# Patient Record
Sex: Female | Born: 1970 | Race: White | Hispanic: No | Marital: Single | Smoking: Never smoker
Health system: Southern US, Community
[De-identification: ages and names within clinical notes are randomized; demographics above are authoritative.]

## PROBLEM LIST (undated history)

## (undated) DIAGNOSIS — E039 Hypothyroidism, unspecified: Secondary | ICD-10-CM

## (undated) HISTORY — PX: CHOLECYSTECTOMY: SHX55

## (undated) HISTORY — PX: APPENDECTOMY: SHX54

## (undated) HISTORY — PX: OTHER SURGICAL HISTORY: SHX169

## (undated) HISTORY — PX: GASTRIC BYPASS: SHX52

---

## 2001-11-25 ENCOUNTER — Encounter: Payer: Self-pay | Admitting: Emergency Medicine

## 2001-11-25 ENCOUNTER — Emergency Department (HOSPITAL_COMMUNITY): Admission: EM | Admit: 2001-11-25 | Discharge: 2001-11-25 | Payer: Self-pay | Admitting: Emergency Medicine

## 2009-12-10 ENCOUNTER — Ambulatory Visit (HOSPITAL_COMMUNITY): Admission: RE | Admit: 2009-12-10 | Discharge: 2009-12-10 | Payer: Self-pay | Admitting: General Surgery

## 2011-01-26 LAB — COMPREHENSIVE METABOLIC PANEL
ALT: 15 U/L (ref 0–35)
AST: 17 U/L (ref 0–37)
Albumin: 3.5 g/dL (ref 3.5–5.2)
Alkaline Phosphatase: 51 U/L (ref 39–117)
BUN: 13 mg/dL (ref 6–23)
CO2: 25 mEq/L (ref 19–32)
Calcium: 8.5 mg/dL (ref 8.4–10.5)
Chloride: 107 mEq/L (ref 96–112)
Creatinine, Ser: 0.68 mg/dL (ref 0.4–1.2)
GFR calc Af Amer: >60 mL/min (ref >60)
GFR calc non Af Amer: >60 mL/min (ref >60)
Glucose, Bld: 93 mg/dL (ref 70–99)
Potassium: 3.2 mEq/L — ABNORMAL LOW (ref 3.5–5.1)
Sodium: 139 mEq/L (ref 135–145)
Total Bilirubin: 0.8 mg/dL (ref 0.3–1.2)
Total Protein: 6.4 g/dL (ref 6.0–8.3)

## 2011-01-26 LAB — PREGNANCY, URINE: Preg Test, Ur: NEGATIVE

## 2013-04-19 ENCOUNTER — Encounter (HOSPITAL_COMMUNITY): Payer: Self-pay | Admitting: Emergency Medicine

## 2013-04-19 ENCOUNTER — Inpatient Hospital Stay (HOSPITAL_COMMUNITY)
Admission: EM | Admit: 2013-04-19 | Discharge: 2013-04-20 | DRG: 883 | Disposition: A | Discharge status: Home / Self Care | Payer: BC Managed Care – PPO | Attending: Surgery | Admitting: Surgery

## 2013-04-19 ENCOUNTER — Emergency Department (HOSPITAL_COMMUNITY): Payer: BC Managed Care – PPO | Admitting: Registered Nurse

## 2013-04-19 ENCOUNTER — Encounter (HOSPITAL_COMMUNITY): Admission: EM | Disposition: A | Discharge status: Home / Self Care | Payer: Self-pay | Source: Home / Self Care

## 2013-04-19 ENCOUNTER — Emergency Department (HOSPITAL_COMMUNITY): Payer: BC Managed Care – PPO

## 2013-04-19 ENCOUNTER — Encounter (HOSPITAL_COMMUNITY): Payer: Self-pay | Admitting: Registered Nurse

## 2013-04-19 DIAGNOSIS — Z79899 Other long term (current) drug therapy: Secondary | ICD-10-CM

## 2013-04-19 DIAGNOSIS — Z791 Long term (current) use of non-steroidal anti-inflammatories (NSAID): Secondary | ICD-10-CM

## 2013-04-19 DIAGNOSIS — K37 Unspecified appendicitis: Secondary | ICD-10-CM

## 2013-04-19 DIAGNOSIS — Z6835 Body mass index (BMI) 35.0-35.9, adult: Secondary | ICD-10-CM

## 2013-04-19 DIAGNOSIS — K3580 Unspecified acute appendicitis: Secondary | ICD-10-CM

## 2013-04-19 DIAGNOSIS — Z9884 Bariatric surgery status: Secondary | ICD-10-CM

## 2013-04-19 HISTORY — PX: LAPAROSCOPIC APPENDECTOMY: SHX408

## 2013-04-19 HISTORY — DX: Hypothyroidism, unspecified: E03.9

## 2013-04-19 LAB — CBC
HCT: 37.8 % (ref 36.0–46.0)
MCHC: 34.9 g/dL (ref 30.0–36.0)
Platelets: 262 10*3/uL (ref 150–400)
RDW: 12.7 % (ref 11.5–15.5)

## 2013-04-19 LAB — URINALYSIS, ROUTINE W REFLEX MICROSCOPIC
Bilirubin Urine: NEGATIVE
Nitrite: NEGATIVE
Protein, ur: NEGATIVE mg/dL
Urobilinogen, UA: 0.2 mg/dL (ref 0.0–1.0)

## 2013-04-19 LAB — URINE MICROSCOPIC-ADD ON

## 2013-04-19 LAB — COMPREHENSIVE METABOLIC PANEL
Alkaline Phosphatase: 99 U/L (ref 39–117)
BUN: 8 mg/dL (ref 6–23)
Chloride: 101 mEq/L (ref 96–112)
GFR calc Af Amer: >90 mL/min (ref >90)
Glucose, Bld: 107 mg/dL — ABNORMAL HIGH (ref 70–99)
Potassium: 3.2 mEq/L — ABNORMAL LOW (ref 3.5–5.1)
Total Bilirubin: 0.7 mg/dL (ref 0.3–1.2)

## 2013-04-19 SURGERY — APPENDECTOMY, LAPAROSCOPIC
Anesthesia: General | Wound class: Dirty or Infected

## 2013-04-19 MED ORDER — CEFOXITIN SODIUM-DEXTROSE 1-4 GM-% IV SOLR (PREMIX)
INTRAVENOUS | Status: AC
  Filled 2013-04-19: qty 100

## 2013-04-19 MED ORDER — NEOSTIGMINE METHYLSULFATE 1 MG/ML IJ SOLN
INTRAMUSCULAR | Status: DC | PRN
  Administered 2013-04-19: 4 mg via INTRAVENOUS

## 2013-04-19 MED ORDER — BUPIVACAINE HCL (PF) 0.25 % IJ SOLN
INTRAMUSCULAR | Status: DC | PRN
  Administered 2013-04-19: 30 mL

## 2013-04-19 MED ORDER — ROCURONIUM BROMIDE 100 MG/10ML IV SOLN
INTRAVENOUS | Status: DC | PRN
  Administered 2013-04-19: 5 mg via INTRAVENOUS
  Administered 2013-04-19: 35 mg via INTRAVENOUS

## 2013-04-19 MED ORDER — GLYCOPYRROLATE 0.2 MG/ML IJ SOLN
INTRAMUSCULAR | Status: DC | PRN
  Administered 2013-04-19: 0.6 mg via INTRAVENOUS

## 2013-04-19 MED ORDER — IOHEXOL 300 MG/ML  SOLN
50.0000 mL | Freq: Once | INTRAMUSCULAR | Status: AC | PRN
  Administered 2013-04-19: 50 mL via ORAL

## 2013-04-19 MED ORDER — PROPOFOL 10 MG/ML IV BOLUS
INTRAVENOUS | Status: DC | PRN
  Administered 2013-04-19: 50 mg via INTRAVENOUS
  Administered 2013-04-19: 200 mg via INTRAVENOUS
  Administered 2013-04-19: 50 mg via INTRAVENOUS

## 2013-04-19 MED ORDER — DEXTROSE 5 % IV SOLN: 1.0000 g | Freq: Two times a day (BID) | INTRAVENOUS | Status: DC

## 2013-04-19 MED ORDER — SUFENTANIL CITRATE 50 MCG/ML IV SOLN
INTRAVENOUS | Status: DC | PRN
  Administered 2013-04-19 (×5): 10 ug via INTRAVENOUS

## 2013-04-19 MED ORDER — IOHEXOL 300 MG/ML  SOLN
100.0000 mL | Freq: Once | INTRAMUSCULAR | Status: AC | PRN
  Administered 2013-04-19: 100 mL via INTRAVENOUS

## 2013-04-19 MED ORDER — MIDAZOLAM HCL 5 MG/5ML IJ SOLN
INTRAMUSCULAR | Status: DC | PRN
  Administered 2013-04-19: 2 mg via INTRAVENOUS

## 2013-04-19 MED ORDER — LACTATED RINGERS IV SOLN
INTRAVENOUS | Status: DC | PRN
  Administered 2013-04-19 (×2): via INTRAVENOUS

## 2013-04-19 MED ORDER — HYDROMORPHONE HCL PF 1 MG/ML IJ SOLN
0.2500 mg | INTRAMUSCULAR | Status: DC | PRN
  Administered 2013-04-20 (×2): 0.5 mg via INTRAVENOUS

## 2013-04-19 MED ORDER — LIDOCAINE HCL (CARDIAC) 20 MG/ML IV SOLN
INTRAVENOUS | Status: DC | PRN
  Administered 2013-04-19: 70 mg via INTRAVENOUS

## 2013-04-19 MED ORDER — DEXTROSE 5 % IV SOLN
1.0000 g | Freq: Two times a day (BID) | INTRAVENOUS | Status: DC
  Administered 2013-04-19: 2 g via INTRAVENOUS
  Filled 2013-04-19 (×2): qty 1

## 2013-04-19 MED ORDER — DEXAMETHASONE SODIUM PHOSPHATE 10 MG/ML IJ SOLN
INTRAMUSCULAR | Status: DC | PRN
  Administered 2013-04-19: 10 mg via INTRAVENOUS

## 2013-04-19 MED ORDER — SODIUM CHLORIDE 0.9 % IV BOLUS (SEPSIS)
1000.0000 mL | Freq: Once | INTRAVENOUS | Status: AC
  Administered 2013-04-19: 1000 mL via INTRAVENOUS

## 2013-04-19 MED ORDER — 0.9 % SODIUM CHLORIDE (POUR BTL) OPTIME
TOPICAL | Status: DC | PRN
  Administered 2013-04-19: 1000 mL

## 2013-04-19 MED ORDER — SUCCINYLCHOLINE CHLORIDE 20 MG/ML IJ SOLN
INTRAMUSCULAR | Status: DC | PRN
  Administered 2013-04-19: 100 mg via INTRAVENOUS

## 2013-04-19 MED ORDER — LACTATED RINGERS IV SOLN: INTRAVENOUS | Status: DC

## 2013-04-19 MED ORDER — LABETALOL HCL 5 MG/ML IV SOLN
INTRAVENOUS | Status: DC | PRN
  Administered 2013-04-19: 5 mg via INTRAVENOUS

## 2013-04-19 MED ORDER — ONDANSETRON HCL 4 MG/2ML IJ SOLN
INTRAMUSCULAR | Status: DC | PRN
  Administered 2013-04-19: 4 mg via INTRAVENOUS

## 2013-04-19 MED ORDER — LACTATED RINGERS IR SOLN
Status: DC | PRN
  Administered 2013-04-19: 1000 mL

## 2013-04-19 SURGICAL SUPPLY — 42 items
ADH SKN CLS APL DERMABOND .7 (GAUZE/BANDAGES/DRESSINGS) ×1
APL SKNCLS STERI-STRIP NONHPOA (GAUZE/BANDAGES/DRESSINGS) ×1
APPLIER CLIP ROT 10 11.4 M/L (STAPLE) ×2
APR CLP MED LRG 11.4X10 (STAPLE) ×1
BAG SPEC RTRVL LRG 6X4 10 (ENDOMECHANICALS) ×1
BENZOIN TINCTURE PRP APPL 2/3 (GAUZE/BANDAGES/DRESSINGS) ×2 IMPLANT
CANISTER SUCTION 2500CC (MISCELLANEOUS) ×2 IMPLANT
CLIP APPLIE ROT 10 11.4 M/L (STAPLE) IMPLANT
CLOTH BEACON ORANGE TIMEOUT ST (SAFETY) ×2 IMPLANT
CUTTER FLEX LINEAR 45M (STAPLE) ×1 IMPLANT
DECANTER SPIKE VIAL GLASS SM (MISCELLANEOUS) ×1 IMPLANT
DERMABOND ADVANCED (GAUZE/BANDAGES/DRESSINGS) ×1
DERMABOND ADVANCED .7 DNX12 (GAUZE/BANDAGES/DRESSINGS) IMPLANT
DRAPE LAPAROSCOPIC ABDOMINAL (DRAPES) ×2 IMPLANT
ELECT REM PT RETURN 9FT ADLT (ELECTROSURGICAL) ×2
ELECTRODE REM PT RTRN 9FT ADLT (ELECTROSURGICAL) ×1 IMPLANT
ENDOLOOP SUT PDS II  0 18 (SUTURE)
ENDOLOOP SUT PDS II 0 18 (SUTURE) IMPLANT
GLOVE BIOGEL PI IND STRL 7.0 (GLOVE) ×1 IMPLANT
GLOVE BIOGEL PI INDICATOR 7.0 (GLOVE) ×1
GLOVE SURG SIGNA 7.5 PF LTX (GLOVE) ×2 IMPLANT
GOWN STRL NON-REIN LRG LVL3 (GOWN DISPOSABLE) ×2 IMPLANT
GOWN STRL REIN XL XLG (GOWN DISPOSABLE) ×4 IMPLANT
KIT BASIN OR (CUSTOM PROCEDURE TRAY) ×2 IMPLANT
PENCIL BUTTON HOLSTER BLD 10FT (ELECTRODE) ×1 IMPLANT
POUCH SPECIMEN RETRIEVAL 10MM (ENDOMECHANICALS) ×2 IMPLANT
RELOAD 45 VASCULAR/THIN (ENDOMECHANICALS) IMPLANT
RELOAD STAPLE 45 2.5 WHT GRN (ENDOMECHANICALS) IMPLANT
RELOAD STAPLE 45 3.5 BLU ETS (ENDOMECHANICALS) IMPLANT
RELOAD STAPLE TA45 3.5 REG BLU (ENDOMECHANICALS) ×2 IMPLANT
SCALPEL HARMONIC ACE (MISCELLANEOUS) ×2 IMPLANT
SET IRRIG TUBING LAPAROSCOPIC (IRRIGATION / IRRIGATOR) ×2 IMPLANT
SOLUTION ANTI FOG 6CC (MISCELLANEOUS) ×2 IMPLANT
STRIP CLOSURE SKIN 1/4X3 (GAUZE/BANDAGES/DRESSINGS) ×2 IMPLANT
SUT MON AB 5-0 PS2 18 (SUTURE) ×2 IMPLANT
TOWEL OR 17X26 10 PK STRL BLUE (TOWEL DISPOSABLE) ×2 IMPLANT
TRAY FOLEY CATH 14FRSI W/METER (CATHETERS) ×2 IMPLANT
TRAY LAP CHOLE (CUSTOM PROCEDURE TRAY) ×2 IMPLANT
TROCAR XCEL BLUNT TIP 100MML (ENDOMECHANICALS) ×3 IMPLANT
TROCAR Z-THREAD FIOS 11X100 BL (TROCAR) ×2 IMPLANT
TROCAR Z-THREAD FIOS 5X100MM (TROCAR) ×2 IMPLANT
TUBING INSUFFLATION 10FT LAP (TUBING) ×2 IMPLANT

## 2013-04-19 NOTE — ED Provider Notes (Signed)
History     CSN: [id number]  Arrival date & time 04/19/13  1549   First MD Initiated Contact with Patient 04/19/13 1552      Chief Complaint  Patient presents with  . Abdominal Pain    (Consider location/radiation/quality/duration/timing/severity/associated sxs/prior treatment) HPI Kristen Dorsey is a 42 y.o. female with a past medical history of cholecystectomy and gastric bypass presents to the emergency department complaining of abdominal pain.  Onset of symptoms began 2 days ago and were located primarily in the right lower quadrant.  Pain and discomfort has gradually worsened and is rated at a 6/10 in severity.  Patient denies radiation.  Associated symptoms include nausea & anorexia .  Last normal bowel movement yesterday. No known exacerbating or alleviating factors.  Symptoms are moderate.  Patient denies chest pain, shortness of breath, change in bowel movements, emesis, hematuria, abnormal vaginal dc, fevers, night sweats and chills. Pt reports black stools, however pt is on Iron supplements. Last meal today at 1 pm (consisted of 2 pieces of bread)   History reviewed. No pertinent past medical history.  Past Surgical History  Procedure Laterality Date  . Gastric bypass    . Cholecystectomy      No family history on file.  History  Substance Use Topics  . Smoking status: Never Smoker   . Smokeless tobacco: Not on file  . Alcohol Use: No    OB History   Grav Para Term Preterm Abortions TAB SAB Ect Mult Living                  Review of Systems Ten systems reviewed and are negative for acute change, except as noted in the HPI.    Allergies  Sulfa antibiotics  Home Medications   Current Outpatient Rx  Name  Route  Sig  Dispense  Refill  . drospirenone-ethinyl estradiol (SYEDA) 3-0.03 MG tablet   Oral   Take 1 tablet by mouth at bedtime.         Marland Kitchen ibuprofen (ADVIL,MOTRIN) 200 MG tablet   Oral   Take 400 mg by mouth every 6 (six) hours as needed  for pain.         . iron polysaccharides (NIFEREX) 150 MG capsule   Oral   Take 150 mg by mouth every morning.         Marland Kitchen levothyroxine (SYNTHROID, LEVOTHROID) 125 MCG tablet   Oral   Take 125 mcg by mouth at bedtime.         . sertraline (ZOLOFT) 100 MG tablet   Oral   Take 100 mg by mouth every morning.           BP 144/72  Pulse 65  Temp(Src) 98.6 F (37 C) (Oral)  Resp 16  SpO2 100%  LMP 04/06/2013  Physical Exam  Constitutional: She is oriented to person, place, and time. She appears well-developed and well-nourished. No distress.  HENT:  Head: Normocephalic and atraumatic.  Mouth/Throat: Oropharynx is clear and moist. No oropharyngeal exudate.  Eyes: Conjunctivae and EOM are normal. Pupils are equal, round, and reactive to light. No scleral icterus.  Neck: Normal range of motion. Neck supple. No tracheal deviation present. No thyromegaly present.  Cardiovascular: Normal rate, regular rhythm, normal heart sounds and intact distal pulses.   Pulmonary/Chest: Effort normal and breath sounds normal. No stridor. No respiratory distress. She has no wheezes.  Abdominal: Soft. There is tenderness.    Musculoskeletal: Normal range of motion. She exhibits no edema  and no tenderness.  Neurological: She is alert and oriented to person, place, and time. Coordination normal.  Skin: Skin is warm and dry. No rash noted. She is not diaphoretic. No erythema. No pallor.  Psychiatric: She has a normal mood and affect. Her behavior is normal.    ED Course  Procedures (including critical care time)  Labs Reviewed  CBC - Abnormal; Notable for the following:    WBC 12.0 (*)    All other components within normal limits  URINALYSIS, ROUTINE W REFLEX MICROSCOPIC - Abnormal; Notable for the following:    APPearance CLOUDY (*)    Hgb urine dipstick TRACE (*)    Ketones, ur 15 (*)    All other components within normal limits  COMPREHENSIVE METABOLIC PANEL - Abnormal; Notable for  the following:    Potassium 3.2 (*)    Glucose, Bld 107 (*)    Albumin 3.4 (*)    All other components within normal limits  URINE MICROSCOPIC-ADD ON   Ct Abdomen Pelvis Wo Contrast  04/19/2013   *RADIOLOGY REPORT*  Clinical Data: Right lower quadrant pain.  CT ABDOMEN AND PELVIS WITHOUT CONTRAST  Technique:  Multidetector CT imaging of the abdomen and pelvis was performed following the standard protocol without intravenous contrast.  Comparison: None.  Findings: Diffuse inflammation of the appendix which is enlarged with surrounding soft tissue inflammation (but without well-defined drainable abscess).  This inflammation extends to involve the cecum. Appendiceal rupture not excluded.  Adjacent tiny lymph node may be reactive origin. Tiny amount of fluid in the pelvis may be related to appendicitis. Other causes such as tumor, infection or primary cecal abnormality felt to be secondary less likely considerations.  Lung bases clear.  Post gastric bypass procedure.  Post cholecystectomy.  Evaluation of solid abdominal viscera is limited by lack of IV contrast.  Taking this limitation into account no worrisome hepatic, splenic, pancreatic, renal, or adrenal lesion.  No abdominal aortic aneurysm.  No worrisome adnexal mass.  No bony destructive lesion.  IMPRESSION: Findings consistent with appendicitis possibly with rupture. Inflammation extends to involve the cecum.  Critical Value/emergent results were called by telephone at the time of interpretation on 04/19/2013  at 8:16 p.m. to Dr. Towanda Malkin, who verbally acknowledged these results.   Original Report Authenticated By: Lacy Duverney, M.D.    nursing failed IV access x 3, IV team failed x 2. US guided IV placed by me, but infiltrated while in CT. Attending placed EJ without complication.   Medications  cefOXitin (MEFOXIN) 1 g in dextrose 5 % 50 mL IVPB ( Intravenous Automatically Held 05/05/13 2200)  lactated ringers infusion (not administered)    HYDROmorphone (DILAUDID) injection 0.25-0.5 mg (0.5 mg Intravenous Given 04/20/13 0014)  HYDROmorphone (DILAUDID) 1 MG/ML injection (not administered)  iohexol (OMNIPAQUE) 300 MG/ML solution 50 mL (50 mLs Oral Contrast Given 04/19/13 1640)  iohexol (OMNIPAQUE) 300 MG/ML solution 100 mL (100 mLs Intravenous Contrast Given 04/19/13 1944)  sodium chloride 0.9 % bolus 1,000 mL (1,000 mLs Intravenous New Bag/Given 04/19/13 2118)      No diagnosis found.    MDM  Appendicitis  42 year old female with no significant past medical history presents emergency department complaining of right lower quadrant abdominal pain onset 2 days ago.  Labs and imaging reviewed showing mild leukocytosis and concern for severe appendicitis with possible rupture and inflammation extending to involve cecum.  Patient's last meal was at 1 o'clock and consisted of a piece of bread.  General surgery consult as above  who will admit patient for surgery.  IV antibiotics started.  The patient appears reasonably stabilized for admission considering the current resources, flow, and capabilities available in the ED at this time, and I doubt any other [facility] requiring further screening and/or treatment in the ED prior to admission.         Jaci Carrel, [place] 04/20/13 [phone]

## 2013-04-19 NOTE — Op Note (Signed)
Re:   Kristen Dorsey DOB:   April 07, 1971 MRN:   [id number]                   FACILITY:  [facility]  DATE OF PROCEDURE: 04/19/2013                              OPERATIVE REPORT  PREOPERATIVE DIAGNOSIS:  Appendicitis  POSTOPERATIVE DIAGNOSIS:  Acute suppurative appendicitis.  PROCEDURE:  Laparoscopic appendectomy.  SURGEON:  Sandria Bales. Ezzard Standing, MD  ASSISTANT:  No first assistant.  ANESTHESIA:  General endotracheal.  Anesthesiologist: Gaetano Hawthorne, MD CRNA: Illene Silver, CRNA  ASA:  1E  ESTIMATED BLOOD LOSS:  Minimal.  DRAINS: none   SPECIMEN:   Appendix  COUNTS CORRECT:  YES  INDICATIONS FOR PROCEDURE: Kristen Dorsey is a 42 y.o. (DOB: 1970/12/24) white  female whose primary care doctor is No primary provider on file. and comes to the OR for an appendectomy.   I discussed with the patient, the indications and potential complications of appendiceal surgery.  The potential complications include, but are not limited to, bleeding, open surgery, bowel resection, and the possibility of another diagnosis.  OPERATIVE NOTE:  The patient underwent a general endotracheal anesthetic as supervised by Anesthesiologist: Gaetano Hawthorne, MD CRNA: Illene Silver, CRNA, General, in room #1.  The patient was given 2 g of cefoxitin at the beginning of the procedure and the abdomen was prepped with ChloraPrep.  The patient had a foley catheter placed at the beginning of the procedure.  A time-out was held and surgical checklist run.  An infraumbilical incision was made with sharp dissection carried down to the abdominal cavity.  An 12 mm Hasson trocar was inserted through the infraumbilical incision and into the peritoneal cavity.  A 0 degree 10 mm laparoscope was inserted through a 12 mm Hasson trocar and the Hasson trocar secured with a 0 Vicryl suture.  I placed a 5 mm trocar in the right upper quadrant and 12 mm torcar in left lower quadrant and did abdominal exploration.    The right and  left lobes of liver unremarkable.  Stomach was unremarkable.  The pelvic organs were unremarkable. The patient had adhesions in the upper midline, under the old incision for the gastric bypass.  The patient had appendicitis with the appendix located in the right lower quadrant.  The cecum was plastered against the right lower quadrant side wall and the appendix came off medially.  I could get to the base of the appendix without having to mobilize the cecum.  The mesentery of the appendix was divided with a Harmonic scalpel.  I got to the base of the appendix.  I then used a blue load 45 mm Ethicon Endo-GIA stapler and fired this across the base of the appendix.  I placed the appendix in EndoCatch bag and delivered the bag through the umbilical incision.  I irrigated the abdomen with 300 cc of saline.  After irrigating the abdomen, I then removed the trocars, in turn.  The umbilical port fascia was closed with 0 Vicryl suture.   I closed the skin each site with a 5-0 Vicryl suture and painted the wounds with Dermabond.  I then injected a total of 30 mL of 0.25% Marcaine at the incisions.  Sponge and needle count were correct at the end of the case.  The foley catheter was removed in the OR.  The  patient was transferred to the recovery room in good condition.  The patient tolerated the procedure well and it depends on the patient's post op clinical course as to when she could be discharged.   Ovidio Kin, MD, [facility] Pager: [phone] Office phone:  [phone]

## 2013-04-19 NOTE — Anesthesia Preprocedure Evaluation (Signed)
Anesthesia Evaluation  Patient identified by MRN, date of birth, ID band Patient awake    Reviewed: Allergy & Precautions, H&P , NPO status , Patient's Chart, lab work & pertinent test results  Airway Mallampati: II TM Distance: >3 FB Neck ROM: full    Dental no notable dental hx.    Pulmonary neg pulmonary ROS,  breath sounds clear to auscultation  Pulmonary exam normal       Cardiovascular Exercise Tolerance: Good negative cardio ROS  Rhythm:regular Rate:Normal     Neuro/Psych negative neurological ROS  negative psych ROS   GI/Hepatic negative GI ROS, Neg liver ROS,   Endo/Other  negative endocrine ROSHypothyroidism   Renal/GU negative Renal ROS  negative genitourinary   Musculoskeletal   Abdominal   Peds  Hematology negative hematology ROS (+)   Anesthesia Other Findings   Reproductive/Obstetrics negative OB ROS                           Anesthesia Physical Anesthesia Plan  ASA: I and emergent  Anesthesia Plan: General   Post-op Pain Management:    Induction: Intravenous, Rapid sequence and Cricoid pressure planned  Airway Management Planned: Oral ETT  Additional Equipment:   Intra-op Plan:   Post-operative Plan: Extubation in OR  Informed Consent: I have reviewed the patients History and Physical, chart, labs and discussed the procedure including the risks, benefits and alternatives for the proposed anesthesia with the patient or authorized representative who has indicated his/her understanding and acceptance.   Dental Advisory Given  Plan Discussed with: CRNA and Surgeon  Anesthesia Plan Comments:         Anesthesia Quick Evaluation

## 2013-04-19 NOTE — ED Notes (Signed)
Two RNs both unsuccessful x2 at IV and blood draw attempts.   IV team paged.

## 2013-04-19 NOTE — H&P (Signed)
Re:   Kristen Dorsey DOB:   September 21, 1971 MRN:   [id number]  ASSESSMENT AND PLAN: 1.  Appendicitis, possibly ruptured per CT scan  I discussed with the patient the indications and risks of appendiceal surgery.  The primary risks of appendiceal surgery include, but are not limited to, bleeding, infection, bowel surgery, and open surgery.  There is also the risk that the patient may have continued symptoms after surgery.  However, the likelihood of improvement in symptoms and return to the patient's normal status is good. We discussed the typical post-operative recovery course. I tried to answer the patient's questions.  2.  S/P gastric bypass   Dr. Jayme Cloud - 2008 3.  Morbid obesity 4.  Anxiety.  Chief Complaint  Patient presents with  . Abdominal Pain   REFERRING PHYSICIAN: Dr. Rhunette Croft, [facility]os  HISTORY OF PRESENT ILLNESS: Kristen Dorsey is a 42 y.o. (DOB: May 05, 1971) white  female whose primary care physician is Dr. Selinda Flavin, [place], and comes to the [facility] with abdominal pain x 18 hours.  She was doing well until last PM when she developed abdominal pain.  She went to bed, but woke up with the same abdominal pain.  She's nauseated, but has not vomited.  She tried to work, but could not because of the pain.  She went to Urgent Care first and then they sent her to the [facility].  Her prior GI history is that she had a RYGB by Dr. Jayme Cloud in 2008.  She lost from 350 to 170, but she is now back up to 250.  She had a lap chole by Dr. Freida Busman around 2011.   History reviewed. No pertinent past medical history.  Past Surgical History  Procedure Laterality Date  . Gastric bypass    . Cholecystectomy       Current Facility-Administered Medications  Medication Dose Route Frequency Provider Last Rate Last Dose  . cefOXitin (MEFOXIN) 1 g in dextrose 5 % 50 mL IVPB  1 g Intravenous Q12H Derwood Kaplan, MD       Current Outpatient Prescriptions  Medication Sig Dispense Refill  .  drospirenone-ethinyl estradiol (SYEDA) 3-0.03 MG tablet Take 1 tablet by mouth at bedtime.      Marland Kitchen ibuprofen (ADVIL,MOTRIN) 200 MG tablet Take 400 mg by mouth every 6 (six) hours as needed for pain.      . iron polysaccharides (NIFEREX) 150 MG capsule Take 150 mg by mouth every morning.      Marland Kitchen levothyroxine (SYNTHROID, LEVOTHROID) 125 MCG tablet Take 125 mcg by mouth at bedtime.      . sertraline (ZOLOFT) 100 MG tablet Take 100 mg by mouth every morning.         Allergies  Allergen Reactions  . Sulfa Antibiotics Hives    REVIEW OF SYSTEMS: Skin:  No history of rash.  No history of abnormal moles. Infection:  No history of hepatitis or HIV.  No history of MRSA. Neurologic:  No history of stroke.  No history of seizure.  No history of headaches. Cardiac:  No history of hypertension. No history of heart disease.  No history of prior cardiac catheterization.  No history of seeing a cardiologist. Pulmonary:  Does not smoke cigarettes.  No asthma or bronchitis.  No OSA/CPAP.  Endocrine:  No diabetes. No thyroid disease. Gastrointestinal:  History of cholecystectomy- 12/10/2009 - A. Freida Busman, and gastric bypass - 2008 - Dr. Jayme Cloud, [place]. Urologic:  No history of kidney stones.  No history of bladder infections.  Musculoskeletal:  No history of joint or back disease. Hematologic:  No bleeding disorder.  No history of anemia.  Not anticoagulated. Psycho-social:  The patient is oriented.   The patient has no obvious psychologic or social impairment to understanding our conversation and plan.  On Zoloft for anxiety.  SOCIAL and FAMILY HISTORY: Single. Lives by self.  From [place], [place]. 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 Friend Dorian Furnace with patient.  PHYSICAL EXAM: BP 144/72  Pulse 65  Temp(Src) 98.6 F (37 C) (Oral)  Resp 16  SpO2 100%  LMP 04/06/2013  General: Heavy WF who is alert and generally healthy appearing.  HEENT:  Normal. Pupils equal. Neck: Supple. No mass.  No thyroid mass. Lymph Nodes:  No supraclavicular or cervical nodes. Lungs: Clear to auscultation and symmetric breath sounds. Heart:  RRR. No murmur or rub. Abdomen: Soft. No mass. No tenderness. No hernia. Normal bowel sounds.  Well healed upper midline scar.  Tender RLQ with mild guarding. Rectal: Not done. Extremities:  Good strength and ROM  in upper and lower extremities. Neurologic:  Grossly intact to motor and sensory function. Psychiatric: Has normal mood and affect. Behavior is normal.   DATA REVIEWED: WBC - 12,000 - 04/19/2013 CT scan - "Findings consistent with appendicitis possibly with rupture. Inflammation extends to involve the cecum."  Kristen Kin, MD,  [facility] [place] [address] [place]  [address]   [place], [place]    [address] Phone:  [phone] FAX:  [phone]

## 2013-04-19 NOTE — ED Notes (Addendum)
Pt c/o gen low abd pain since last night.  States that she went to urgent care and they sent her here to r/o appy.  States she is nauseated but denies V/D.  Pt brought a CD of test results.

## 2013-04-19 NOTE — ED Notes (Signed)
IV unsuccessful getting IV, blood unable to be drawn yet. Charge unsuccessful IV attempt. PA will attempt with ultrasound

## 2013-04-20 ENCOUNTER — Encounter (HOSPITAL_COMMUNITY): Payer: Self-pay | Admitting: *Deleted

## 2013-04-20 MED ORDER — CEFOXITIN SODIUM 1 G IV SOLR
1.0000 g | Freq: Four times a day (QID) | INTRAVENOUS | Status: DC
  Administered 2013-04-20: 1 g via INTRAVENOUS
  Filled 2013-04-20 (×5): qty 1

## 2013-04-20 MED ORDER — LEVOTHYROXINE SODIUM 125 MCG PO TABS
125.0000 ug | ORAL_TABLET | Freq: Every day | ORAL | Status: DC
  Filled 2013-04-20: qty 1

## 2013-04-20 MED ORDER — SERTRALINE HCL 100 MG PO TABS
100.0000 mg | ORAL_TABLET | Freq: Every morning | ORAL | Status: DC
  Administered 2013-04-20: 100 mg via ORAL
  Filled 2013-04-20: qty 1

## 2013-04-20 MED ORDER — HYDROMORPHONE HCL PF 1 MG/ML IJ SOLN
INTRAMUSCULAR | Status: AC
  Filled 2013-04-20: qty 1

## 2013-04-20 MED ORDER — HYDROCODONE-ACETAMINOPHEN 5-325 MG PO TABS: 1.0000 | ORAL_TABLET | ORAL | Status: DC | PRN

## 2013-04-20 MED ORDER — ONDANSETRON HCL 4 MG/2ML IJ SOLN: 4.0000 mg | Freq: Four times a day (QID) | INTRAMUSCULAR | Status: DC | PRN

## 2013-04-20 MED ORDER — HEPARIN SODIUM (PORCINE) 5000 UNIT/ML IJ SOLN
5000.0000 [IU] | Freq: Three times a day (TID) | INTRAMUSCULAR | Status: DC
  Administered 2013-04-20: 5000 [IU] via SUBCUTANEOUS
  Filled 2013-04-20 (×4): qty 1

## 2013-04-20 MED ORDER — KCL IN DEXTROSE-NACL 20-5-0.45 MEQ/L-%-% IV SOLN
INTRAVENOUS | Status: DC
  Administered 2013-04-20: 03:00:00 via INTRAVENOUS
  Filled 2013-04-20 (×3): qty 1000

## 2013-04-20 MED ORDER — MORPHINE SULFATE 2 MG/ML IJ SOLN: 1.0000 mg | INTRAMUSCULAR | Status: DC | PRN

## 2013-04-20 MED ORDER — ONDANSETRON HCL 4 MG PO TABS: 4.0000 mg | ORAL_TABLET | Freq: Four times a day (QID) | ORAL | Status: DC | PRN

## 2013-04-20 MED ORDER — IBUPROFEN 600 MG PO TABS
600.0000 mg | ORAL_TABLET | Freq: Four times a day (QID) | ORAL | Status: DC | PRN
  Filled 2013-04-20: qty 1

## 2013-04-20 NOTE — Transfer of Care (Signed)
Immediate Anesthesia Transfer of Care Note  Patient: Kristen Dorsey  Procedure(s) Performed: Procedure(s): APPENDECTOMY LAPAROSCOPIC (N/A)  Patient Location: PACU  Anesthesia Type:General  Level of Consciousness: awake, alert , oriented and patient cooperative  Airway & Oxygen Therapy: Patient Spontanous Breathing and Patient connected to face mask oxygen  Post-op Assessment: Report given to PACU RN, Post -op Vital signs reviewed and stable and Patient moving all extremities X 4  Post vital signs: stable  Complications: No apparent anesthesia complications

## 2013-04-20 NOTE — ED Provider Notes (Signed)
Shared service with midlevel provider. I have personally seen and examined the patient, providing direct face to face care, presenting with the chief complaint of RLQ pain. Physical exam findings include RLQ tenderness, with McBurneys. Plan will be to admit to OR for appendectomy. Antibiotics started. I have reviewed the nursing documentation on past medical history, family history, and social history.  Angiocath insertion Performed by: Derwood Kaplan  Consent: Verbal consent obtained. Risks and benefits: risks, benefits and alternatives were discussed Time out: Immediately prior to procedure a "time out" was called to verify the correct patient, procedure, equipment, support staff and site/side marked as required.  Preparation: Patient was prepped and draped in the usual sterile fashion.  Vein Location: right EJ  Ultrasound Guided  Gauge: 20 gauge  Normal blood return and flush without difficulty Patient tolerance: Patient tolerated the procedure well with no immediate complications.      Derwood Kaplan, MD 04/20/13 1526

## 2013-04-20 NOTE — Anesthesia Postprocedure Evaluation (Addendum)
  Anesthesia Post-op Note  Patient: Kristen Dorsey  Procedure(s) Performed: Procedure(s) (LRB): APPENDECTOMY LAPAROSCOPIC (N/A)  Patient Location: PACU  Anesthesia Type: General  Level of Consciousness: awake and alert   Airway and Oxygen Therapy: Patient Spontanous Breathing  Post-op Pain: mild  Post-op Assessment: Post-op Vital signs reviewed, Patient's Cardiovascular Status Stable, Respiratory Function Stable, Patent Airway and No signs of Nausea or vomiting  Last Vitals:  Filed Vitals:   04/19/13 2345  BP: 146/81  Pulse: 73  Temp:   Resp: 20    Post-op Vital Signs: stable   Complications: No apparent anesthesia complications

## 2013-04-20 NOTE — Discharge Summary (Signed)
Physician Discharge Summary  Patient ID: Kristen Dorsey MRN: [id number] DOB/AGE: 1971/10/22 42 y.o.  Admit date: 04/19/2013 Discharge date: 04/20/2013  Admission Diagnoses:  appendicitis  Discharge Diagnoses:  appendicitis  Active Problems:   * No active hospital problems. *   Surgery:  Laparoscopic appendectomy  Discharged Condition: improved  Hospital Course:   Admitted Saturday night and ready for discharge on Sunday.  Taking POs  Consults: none  Significant Diagnostic Studies: none    Discharge Exam: Blood pressure 124/70, pulse 72, temperature 98.2 F (36.8 C), temperature source Oral, resp. rate 20, height 5\' 10"  (1.778 m), weight 250 lb (113.399 kg), last menstrual period 04/06/2013, SpO2 100.00%. Abdomen is minimally sore.  Improved.   Disposition: Final discharge disposition not confirmed  Discharge Orders   Future Orders Complete By Expires     Diet - low sodium heart healthy  As directed     Increase activity slowly  As directed     Remove dressing in 24 hours  As directed     Scheduling Instructions:      May shower at home        Medication List    TAKE these medications       HYDROcodone-acetaminophen 5-325 MG per tablet  Commonly known as:  NORCO/VICODIN  Take 1-2 tablets by mouth every 4 (four) hours as needed.     ibuprofen 200 MG tablet  Commonly known as:  ADVIL,MOTRIN  Take 400 mg by mouth every 6 (six) hours as needed for pain.     iron polysaccharides 150 MG capsule  Commonly known as:  NIFEREX  Take 150 mg by mouth every morning.     levothyroxine 125 MCG tablet  Commonly known as:  SYNTHROID, LEVOTHROID  Take 125 mcg by mouth at bedtime.     sertraline 100 MG tablet  Commonly known as:  ZOLOFT  Take 100 mg by mouth every morning.     SYEDA 3-0.03 MG tablet  Generic drug:  drospirenone-ethinyl estradiol  Take 1 tablet by mouth at bedtime.           Follow-up Information   Follow up with Kandis Cocking, MD.   Contact information:   [address] [place] [address] [phone]       Signed: Valarie Merino 04/20/2013, 9:11 AM

## 2013-04-21 ENCOUNTER — Encounter (HOSPITAL_COMMUNITY): Payer: Self-pay | Admitting: Surgery

## 2013-05-13 ENCOUNTER — Ambulatory Visit (INDEPENDENT_AMBULATORY_CARE_PROVIDER_SITE_OTHER): Payer: BC Managed Care – PPO | Admitting: Surgery

## 2013-05-13 ENCOUNTER — Encounter (INDEPENDENT_AMBULATORY_CARE_PROVIDER_SITE_OTHER): Payer: Self-pay | Admitting: Surgery

## 2013-05-13 VITALS — BP 128/74 | HR 70 | Temp 98.4°F | Resp 16 | Ht 70.0 in | Wt 242.8 lb

## 2013-05-13 DIAGNOSIS — K3580 Unspecified acute appendicitis: Secondary | ICD-10-CM

## 2013-05-13 NOTE — Progress Notes (Signed)
[facility]  Ovidio Kin, MD,  FACS [address] [place]  [address] [place], [place]    [address] Phone:  [phone] FAX:  [phone]   Re:   Kristen Dorsey DOB:   1971-06-04 MRN:   [id number]  ASSESSMENT AND PLAN: 1. Appendicitis, possibly ruptured per CT scan   Lap appendectomy - 04/21/2013 - D. Catrell Morrone  Did well.  Return PRN. 2. S/P gastric bypass   Dr. Jayme Cloud - 2008  3. Morbid obesity  4. Anxiety.  HISTORY OF PRESENT ILLNESS: Chief Complaint  Patient presents with  . Routine Post Op    1st po apendectomy   Kristen Dorsey is a 42 y.o. (DOB: 02/20/1971)  whfemale who is a patient of HOWARD, KEVIN, MD and comes to me today for follow up appendectomy. She has done well.  Was ready to go back to work soon after surgery.  No issues. I wrote her a note to return to work 05/14/2013.  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  PHYSICAL EXAM: BP 128/74  Pulse 70  Temp(Src) 98.4 F (36.9 C) (Temporal)  Resp 16  Ht 5\' 10"  (1.778 m)  Wt 242 lb 12.8 oz (110.133 kg)  BMI 34.84 kg/m2  LMP 04/06/2013  Abdomen:  Soft.  BS present.  Wounds look good.  DATA REVIEWED: Path to patient.  Ovidio Kin, MD, FACS Office:  [phone]

## 2013-11-24 ENCOUNTER — Ambulatory Visit: Payer: BC Managed Care – PPO | Attending: Specialist | Admitting: Physical Therapy

## 2013-11-24 DIAGNOSIS — M25569 Pain in unspecified knee: Secondary | ICD-10-CM | POA: Insufficient documentation

## 2013-11-24 DIAGNOSIS — IMO0001 Reserved for inherently not codable concepts without codable children: Secondary | ICD-10-CM | POA: Insufficient documentation

## 2013-11-24 DIAGNOSIS — R269 Unspecified abnormalities of gait and mobility: Secondary | ICD-10-CM | POA: Insufficient documentation

## 2013-11-24 DIAGNOSIS — R5381 Other malaise: Secondary | ICD-10-CM | POA: Insufficient documentation

## 2013-11-24 DIAGNOSIS — Z9884 Bariatric surgery status: Secondary | ICD-10-CM | POA: Insufficient documentation

## 2013-11-24 DIAGNOSIS — M25669 Stiffness of unspecified knee, not elsewhere classified: Secondary | ICD-10-CM | POA: Insufficient documentation

## 2013-11-26 ENCOUNTER — Ambulatory Visit: Payer: BC Managed Care – PPO | Admitting: Physical Therapy

## 2013-12-02 ENCOUNTER — Ambulatory Visit: Payer: BC Managed Care – PPO | Admitting: *Deleted

## 2013-12-04 ENCOUNTER — Ambulatory Visit: Payer: BC Managed Care – PPO | Admitting: *Deleted

## 2013-12-09 ENCOUNTER — Ambulatory Visit: Payer: BC Managed Care – PPO | Attending: Specialist | Admitting: *Deleted

## 2013-12-09 DIAGNOSIS — Z9884 Bariatric surgery status: Secondary | ICD-10-CM | POA: Insufficient documentation

## 2013-12-09 DIAGNOSIS — R5381 Other malaise: Secondary | ICD-10-CM | POA: Diagnosis not present

## 2013-12-09 DIAGNOSIS — M25569 Pain in unspecified knee: Secondary | ICD-10-CM | POA: Insufficient documentation

## 2013-12-09 DIAGNOSIS — M25669 Stiffness of unspecified knee, not elsewhere classified: Secondary | ICD-10-CM | POA: Diagnosis not present

## 2013-12-09 DIAGNOSIS — R269 Unspecified abnormalities of gait and mobility: Secondary | ICD-10-CM | POA: Diagnosis not present

## 2013-12-09 DIAGNOSIS — IMO0001 Reserved for inherently not codable concepts without codable children: Secondary | ICD-10-CM | POA: Diagnosis present

## 2013-12-11 ENCOUNTER — Ambulatory Visit: Payer: BC Managed Care – PPO | Admitting: *Deleted

## 2013-12-11 DIAGNOSIS — IMO0001 Reserved for inherently not codable concepts without codable children: Secondary | ICD-10-CM | POA: Diagnosis not present

## 2013-12-16 ENCOUNTER — Ambulatory Visit: Payer: BC Managed Care – PPO | Admitting: Physical Therapy

## 2013-12-16 DIAGNOSIS — IMO0001 Reserved for inherently not codable concepts without codable children: Secondary | ICD-10-CM | POA: Diagnosis not present

## 2013-12-18 ENCOUNTER — Ambulatory Visit: Payer: BC Managed Care – PPO | Admitting: Physical Therapy

## 2013-12-18 DIAGNOSIS — IMO0001 Reserved for inherently not codable concepts without codable children: Secondary | ICD-10-CM | POA: Diagnosis not present

## 2013-12-23 ENCOUNTER — Encounter: Payer: BC Managed Care – PPO | Admitting: Physical Therapy

## 2013-12-25 ENCOUNTER — Ambulatory Visit: Payer: BC Managed Care – PPO | Admitting: *Deleted

## 2013-12-25 DIAGNOSIS — IMO0001 Reserved for inherently not codable concepts without codable children: Secondary | ICD-10-CM | POA: Diagnosis not present

## 2013-12-30 ENCOUNTER — Encounter: Payer: BC Managed Care – PPO | Admitting: *Deleted

## 2014-01-01 ENCOUNTER — Encounter: Payer: BC Managed Care – PPO | Admitting: *Deleted

## 2014-01-05 ENCOUNTER — Encounter (HOSPITAL_COMMUNITY): Payer: Self-pay | Admitting: Emergency Medicine

## 2014-01-05 ENCOUNTER — Emergency Department (HOSPITAL_COMMUNITY)
Admission: EM | Admit: 2014-01-05 | Discharge: 2014-01-05 | Disposition: A | Discharge status: Home / Self Care | Payer: BC Managed Care – PPO | Attending: Emergency Medicine | Admitting: Emergency Medicine

## 2014-01-05 DIAGNOSIS — F411 Generalized anxiety disorder: Secondary | ICD-10-CM | POA: Insufficient documentation

## 2014-01-05 DIAGNOSIS — E039 Hypothyroidism, unspecified: Secondary | ICD-10-CM | POA: Insufficient documentation

## 2014-01-05 DIAGNOSIS — F3289 Other specified depressive episodes: Secondary | ICD-10-CM | POA: Insufficient documentation

## 2014-01-05 DIAGNOSIS — F419 Anxiety disorder, unspecified: Secondary | ICD-10-CM

## 2014-01-05 DIAGNOSIS — F329 Major depressive disorder, single episode, unspecified: Secondary | ICD-10-CM | POA: Insufficient documentation

## 2014-01-05 DIAGNOSIS — Z3202 Encounter for pregnancy test, result negative: Secondary | ICD-10-CM | POA: Insufficient documentation

## 2014-01-05 DIAGNOSIS — Z79899 Other long term (current) drug therapy: Secondary | ICD-10-CM | POA: Insufficient documentation

## 2014-01-05 LAB — RAPID URINE DRUG SCREEN, HOSP PERFORMED
AMPHETAMINES: NOT DETECTED
BARBITURATES: NOT DETECTED
BENZODIAZEPINES: NOT DETECTED
COCAINE: NOT DETECTED
Opiates: NOT DETECTED
Tetrahydrocannabinol: NOT DETECTED

## 2014-01-05 LAB — PREGNANCY, URINE: PREG TEST UR: NEGATIVE

## 2014-01-05 NOTE — ED Notes (Signed)
Pt states she has been "more depressed and anxious than usual recently". Pt was seen by PCP about her symptoms and was referred to "a doctor at behavioral health". [facility] and was advised to come to ED for psychiatric evaluation. Pt is a Engineer, siteschool teacher and states her work "has become a lot to handle". Pt denies SI/HI. Pt calm and cooperative during assessment. Belonging stored in EMS room.

## 2014-01-05 NOTE — ED Notes (Addendum)
Sent by her MD for eval, unable to get appt until April with [facility]  [place].  Says she feels anxious and stressed.    Denies being suicidal   Wanded at triage

## 2014-01-05 NOTE — ED Provider Notes (Signed)
CSN: [id number]     Arrival date & time 01/05/14  1115 History   This chart was scribed for Doug SouSam [name], MD by Ladona Ridgelaylor Day, ED scribe. This patient was seen in room APA16A/APA16A and the patient's care was started at 1115.  Chief Complaint  Patient presents with  . V70.1   Patient is a 43 y.o. female presenting with anxiety. The history is provided by the patient. No language interpreter was used.  Anxiety This is a new problem. The current episode started more than 1 week ago. The problem occurs constantly. The problem has been gradually worsening. Pertinent negatives include no chest pain and no abdominal pain. Nothing aggravates the symptoms. Nothing relieves the symptoms. She has tried nothing for the symptoms.   HPI Comments: Kristen Dorsey is a 43 y.o. 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 She had a referral from her PCP to meet w/a counselor but was unable to get an appt until 2 months from now. She reports this problem has been ongoing for a few months. She denies any SI or HI. Her PCP Dr. Dimas AguasHoward rx her w/xanax which she takes as needed to help her sleep or anxiety with partial relief. She also takes Zoloft.   She denies med hx. She does not smoke/drink. Takes no illegal drugs.  She is allergic to Sulfa  Past Medical History  Diagnosis Date  . Hypothyroidism    Past Surgical History  Procedure Laterality Date  . Gastric bypass    . Cholecystectomy    . L knee lateral release Right   . Laparoscopic appendectomy N/A 04/19/2013    Procedure: APPENDECTOMY LAPAROSCOPIC;  Surgeon: Kandis Cockingavid H Newman, MD;  Location: [facility];  Service: General;  Laterality: N/A;  . Appendectomy  [id number]   Family History  Problem Relation Age of Onset  . Heart disease Father    History  Substance Use Topics  . Smoking  status: Never Smoker   . Smokeless tobacco: Never Used  . Alcohol Use: No   OB History   Grav Para Term Preterm Abortions TAB SAB Ect Mult Living                 Review of Systems  Constitutional: Negative.   HENT: Negative.   Respiratory: Negative.   Cardiovascular: Negative.  Negative for chest pain.  Gastrointestinal: Negative.  Negative for abdominal pain.  Musculoskeletal: Negative.   Skin: Negative.   Neurological: Negative.   Psychiatric/Behavioral: Negative for suicidal ideas and self-injury. The patient is nervous/anxious.   All other systems reviewed and are negative.   Allergies  Sulfa antibiotics  Home Medications   Current Outpatient Rx  Name  Route  Sig  Dispense  Refill  . drospirenone-ethinyl estradiol (SYEDA) 3-0.03 MG tablet   Oral   Take 1 tablet by mouth at bedtime.         Marland Kitchen. levothyroxine (SYNTHROID, LEVOTHROID) 125 MCG tablet   Oral   Take 125 mcg by mouth at bedtime.         Marland Kitchen. loratadine (CLARITIN) 10 MG tablet   Oral   Take 10 mg by mouth daily.         Marland Kitchen. POLY-IRON 150 FORTE 150-25-1 MG-MCG-MG CAPS   Oral   Take 1 capsule by mouth daily.          .Marland Kitchen  sertraline (ZOLOFT) 100 MG tablet   Oral   Take 100 mg by mouth every morning.          Triage Vitals: BP 142/79  Pulse 87  Temp(Src) 98.4 F (36.9 C) (Oral)  Resp 20  Ht 5\' 11"  (1.803 m)  Wt 250 lb (113.399 kg)  BMI 34.88 kg/m2  SpO2 100%  LMP 12/22/2013  Physical Exam  Nursing note and vitals reviewed. Constitutional: She is oriented to person, place, and time. She appears well-developed and well-nourished. No distress.  HENT:  Head: Normocephalic and atraumatic.  Eyes: Conjunctivae are normal. Right eye exhibits no discharge. Left eye exhibits no discharge.  Neck: Normal range of motion.  Cardiovascular: Normal rate.   Pulmonary/Chest: Effort normal. No respiratory distress.  Musculoskeletal: Normal range of motion. She exhibits no edema.  Neurological: She is alert  and oriented to person, place, and time.  Skin: Skin is warm and dry.  Psychiatric: She has a normal mood and affect. Thought content normal.    ED Course  Procedures (including critical care time) DIAGNOSTIC STUDIES: Oxygen Saturation is 100% on room air, normal by my interpretation.    COORDINATION OF CARE: At 1235 PM Discussed treatment plan with patient which includes UD, pregnancy UC. Patient agrees.   Labs Review Labs Reviewed  URINE RAPID DRUG SCREEN (HOSP PERFORMED)  PREGNANCY, URINE   Imaging Review No results found.   EKG Interpretation None      Results for orders placed during the hospital encounter of 01/05/14  URINE RAPID DRUG SCREEN (HOSP PERFORMED)      Result Value Ref Range   Opiates NONE DETECTED  NONE DETECTED   Cocaine NONE DETECTED  NONE DETECTED   Benzodiazepines NONE DETECTED  NONE DETECTED   Amphetamines NONE DETECTED  NONE DETECTED   Tetrahydrocannabinol NONE DETECTED  NONE DETECTED   Barbiturates NONE DETECTED  NONE DETECTED  PREGNANCY, URINE      Result Value Ref Range   Preg Test, Ur NEGATIVE  NEGATIVE   No results found.   MDM   Final diagnoses:  None   patient requires counseling. She is not risk for self-harm Plan referral [facility], [facility] and [facility] Diagnosis anxiety    I personally performed the services described in this documentation, which was scribed in my presence. The recorded information has been reviewed and is accurate.      Doug Sou, MD 01/05/14 1435

## 2014-01-05 NOTE — Discharge Instructions (Signed)
Panic Attacks Call the [facility], [facility] or [facility] to arrange to receive help with  Anxiety. If you have any thought of harming yourself or others call 911 immediately Panic attacks are sudden, short feelings of great fear or discomfort. You may have them for no reason when you are relaxed, when you are uneasy (anxious), or when you are sleeping.  HOME CARE  Take all your medicines as told.  Check with your doctor before starting new medicines.  Keep all doctor visits. GET HELP IF:  You are not able to take your medicines as told.  Your symptoms do not get better.  Your symptoms get worse. GET HELP RIGHT AWAY IF:  Your attacks seem different than your normal attacks.  You have thoughts about hurting yourself or others.  You take panic attack medicine and you have a side effect. MAKE SURE YOU:  Understand these instructions.  Will watch your condition.  Will get help right away if you are not doing well or get worse. Document Released: 11/25/2010 Document Revised: 08/13/2013 Document Reviewed: 06/06/2013 [facility] Patient Information 2014 [place] [place].

## 2014-01-06 ENCOUNTER — Ambulatory Visit: Payer: BC Managed Care – PPO | Attending: Specialist | Admitting: Physical Therapy

## 2014-01-06 DIAGNOSIS — IMO0001 Reserved for inherently not codable concepts without codable children: Secondary | ICD-10-CM | POA: Insufficient documentation

## 2014-01-06 DIAGNOSIS — M25669 Stiffness of unspecified knee, not elsewhere classified: Secondary | ICD-10-CM | POA: Diagnosis not present

## 2014-01-06 DIAGNOSIS — R5381 Other malaise: Secondary | ICD-10-CM | POA: Diagnosis not present

## 2014-01-06 DIAGNOSIS — M25569 Pain in unspecified knee: Secondary | ICD-10-CM | POA: Diagnosis not present

## 2014-01-06 DIAGNOSIS — Z9884 Bariatric surgery status: Secondary | ICD-10-CM | POA: Diagnosis not present

## 2014-01-06 DIAGNOSIS — R269 Unspecified abnormalities of gait and mobility: Secondary | ICD-10-CM | POA: Diagnosis not present

## 2014-01-08 ENCOUNTER — Ambulatory Visit: Payer: BC Managed Care – PPO | Admitting: Physical Therapy

## 2014-01-08 DIAGNOSIS — IMO0001 Reserved for inherently not codable concepts without codable children: Secondary | ICD-10-CM | POA: Diagnosis not present

## 2014-01-13 ENCOUNTER — Ambulatory Visit: Payer: BC Managed Care – PPO | Admitting: Physical Therapy

## 2014-01-13 DIAGNOSIS — IMO0001 Reserved for inherently not codable concepts without codable children: Secondary | ICD-10-CM | POA: Diagnosis not present

## 2014-01-15 ENCOUNTER — Ambulatory Visit: Payer: BC Managed Care – PPO | Admitting: Physical Therapy

## 2014-01-15 DIAGNOSIS — IMO0001 Reserved for inherently not codable concepts without codable children: Secondary | ICD-10-CM | POA: Diagnosis not present

## 2014-06-12 IMAGING — CT CT ABD-PELV W/O CM
1 of 2 series · 15 of 32 positions shown, 19 images · non-contrast
Comparison: None.

CLINICAL DATA: Right lower quadrant pain.

CT ABDOMEN AND PELVIS WITHOUT CONTRAST
TECHNIQUE: Multidetector CT imaging of the abdomen and pelvis was
performed following the standard protocol without intravenous
contrast.

[Series 2: abd/pel w/o · axial · non-contrast · 0.78mm/px · z∈[-484,-49]mm · 15 of 95 slices shown, 19 images]
[im 4/95  soft-tissue]
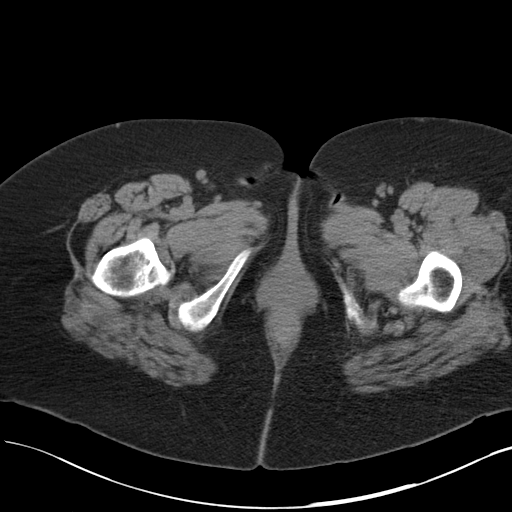
[im 4/95  bone]
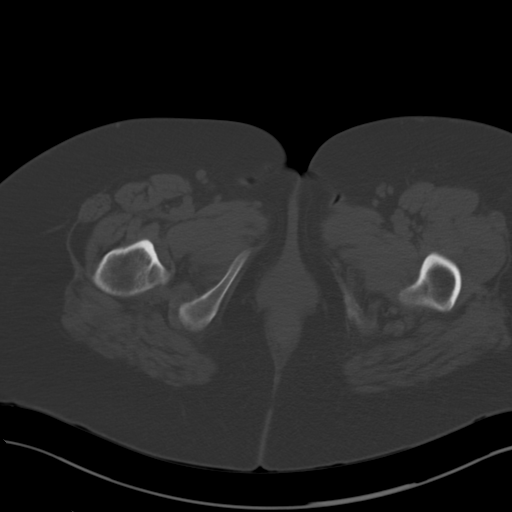
[im 12/95  soft-tissue]
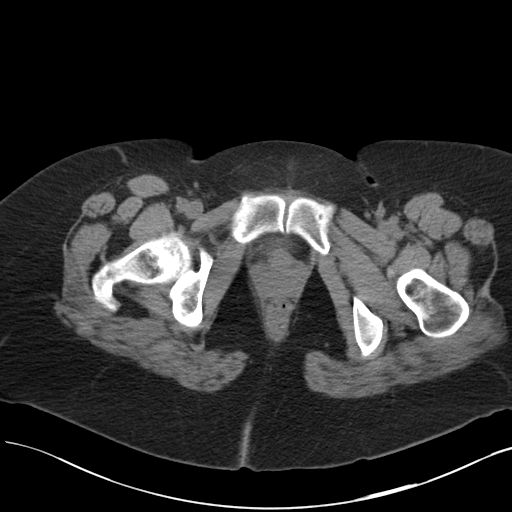
[im 19/95  soft-tissue]
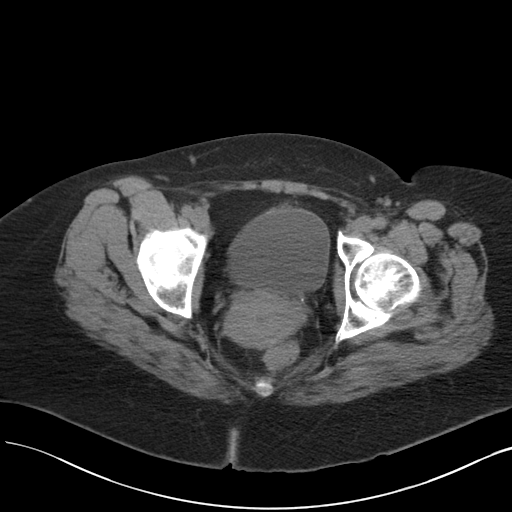
[im 27/95  soft-tissue]
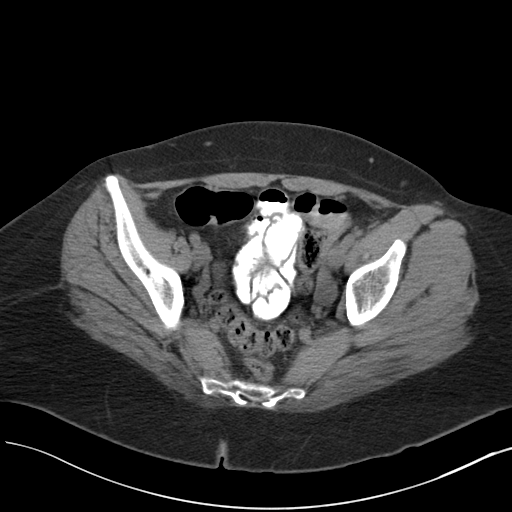
[im 34/95  soft-tissue]
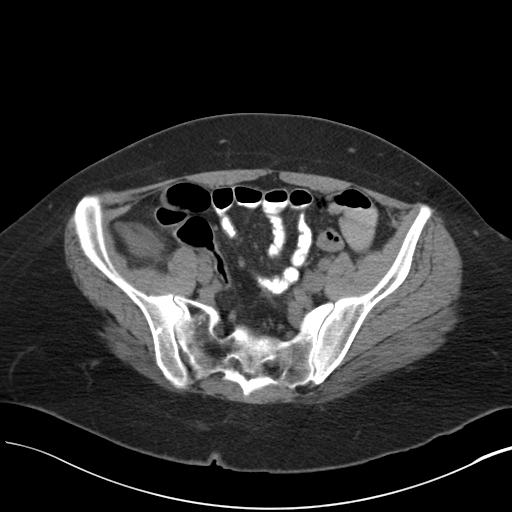
[im 42/95  soft-tissue]
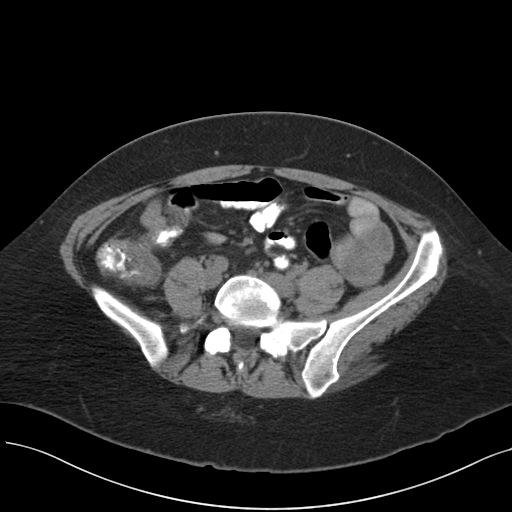
[im 49/95  soft-tissue]
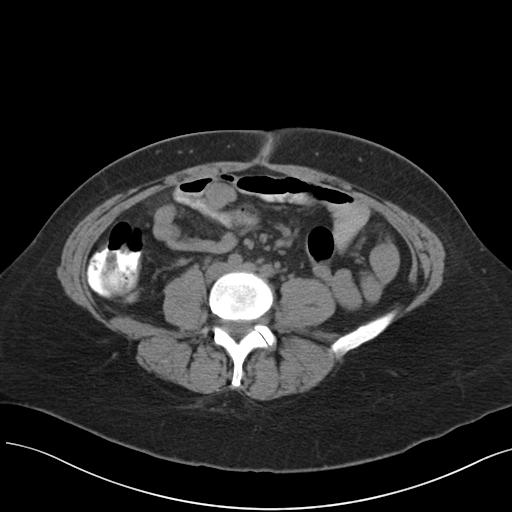
[im 53/95  soft-tissue]
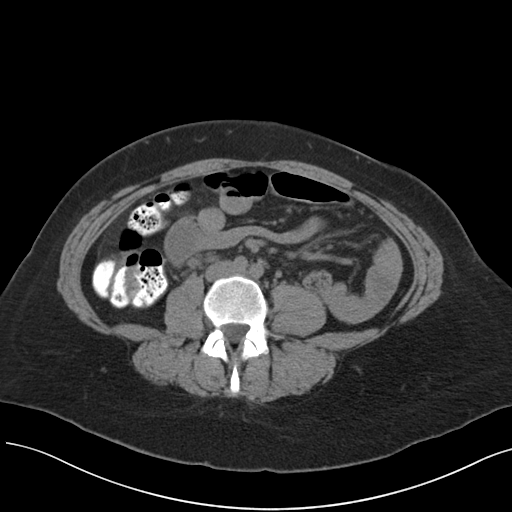
[im 61/95  soft-tissue]
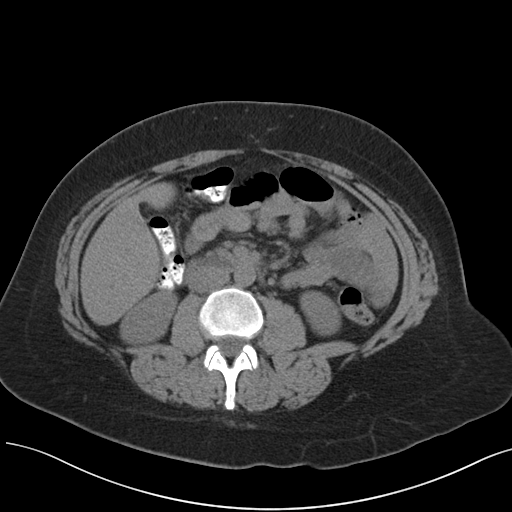
[im 61/95  bone]
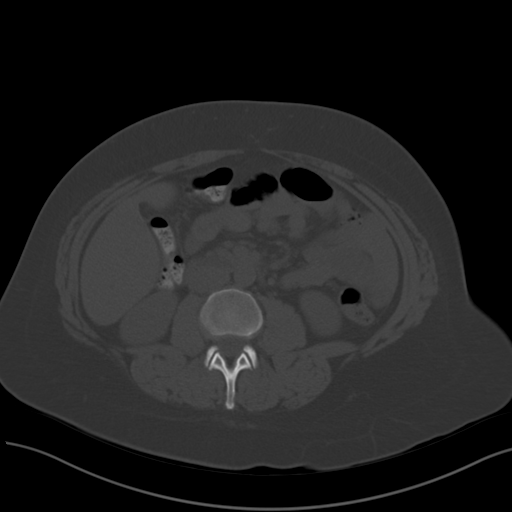
[im 68/95  soft-tissue]
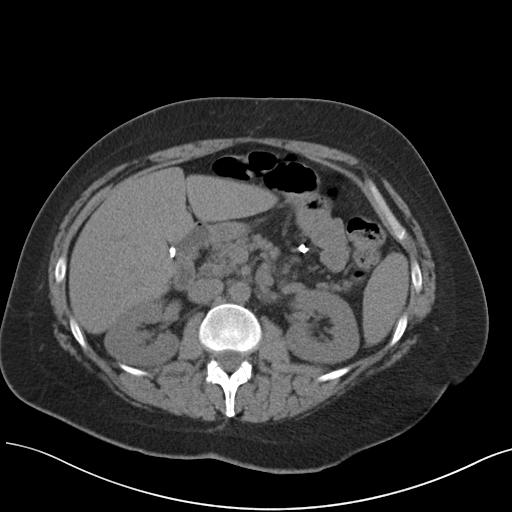
[im 76/95  soft-tissue]
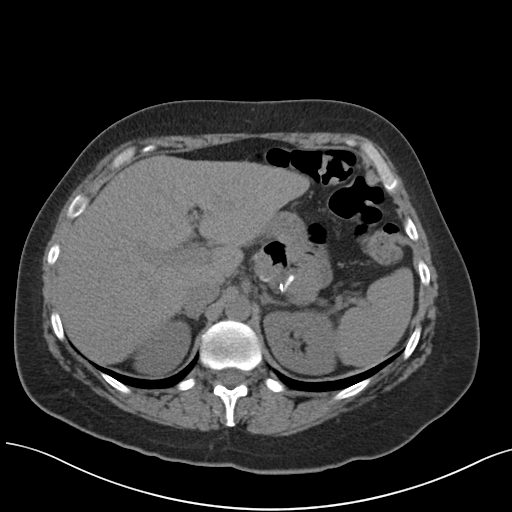
[im 79/95  lung]
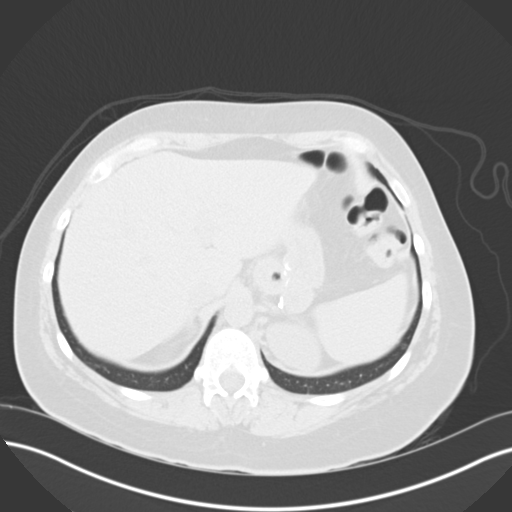
[im 83/95  soft-tissue]
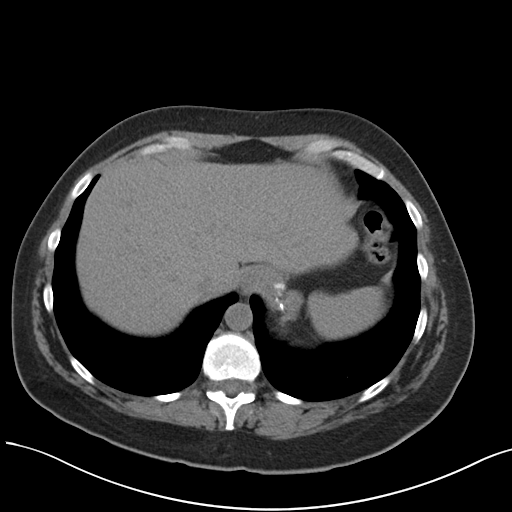
[im 83/95  lung]
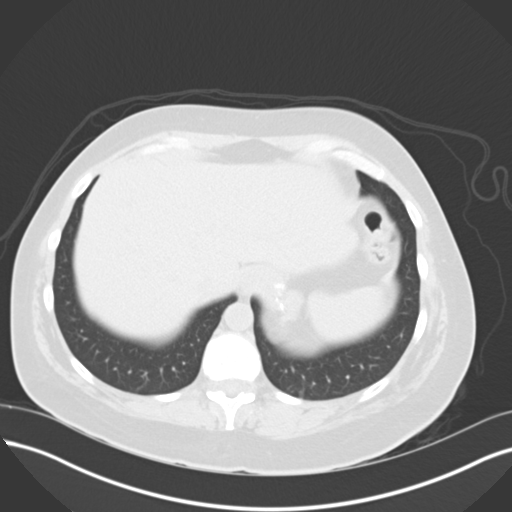
[im 87/95  lung]
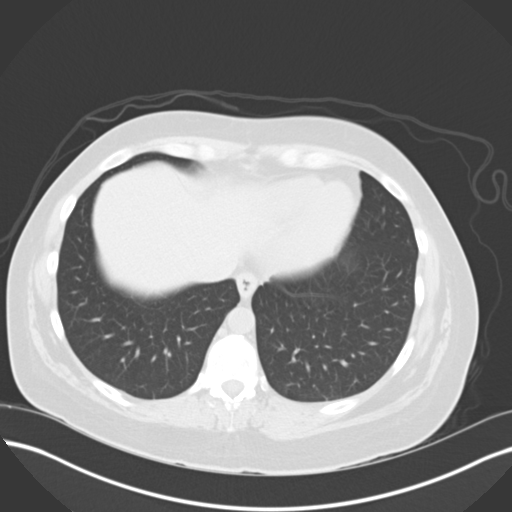
[im 91/95  soft-tissue]
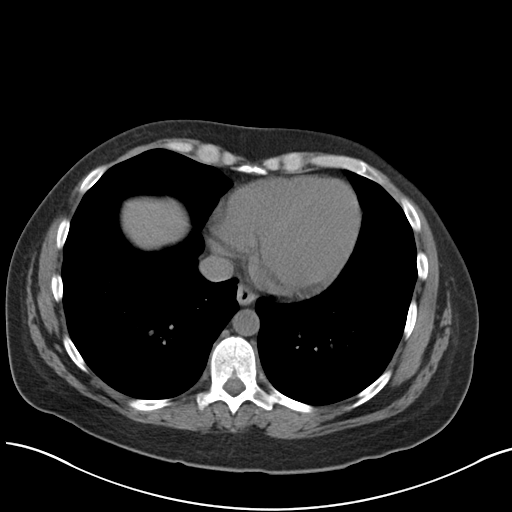
[im 91/95  lung]
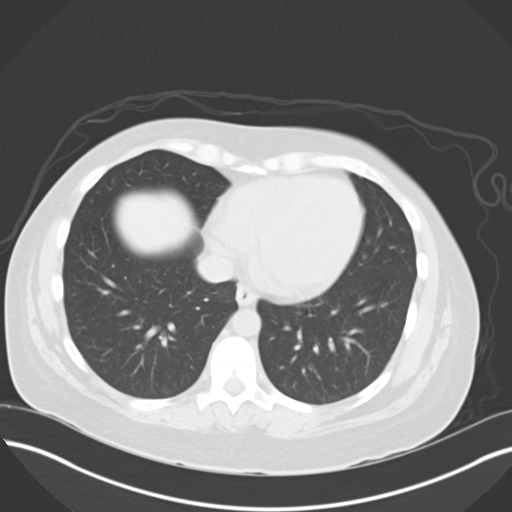

[15 of 32 positions shown; findings below may reference images not displayed]

FINDINGS: Diffuse inflammation of the appendix which is enlarged
with surrounding soft tissue inflammation (but without well-defined
drainable abscess).  This inflammation extends to involve the
cecum. Appendiceal rupture not excluded.  Adjacent tiny lymph node
may be reactive origin. Tiny amount of fluid in the pelvis may be
related to appendicitis. Other causes such as tumor, infection or
primary cecal abnormality felt to be secondary less likely
considerations.

Lung bases clear.  Post gastric bypass procedure.  Post
cholecystectomy.

Evaluation of solid abdominal viscera is limited by lack of IV
contrast.  Taking this limitation into account no worrisome
hepatic, splenic, pancreatic, renal, or adrenal lesion.

No abdominal aortic aneurysm.

No worrisome adnexal mass.

No bony destructive lesion.
IMPRESSION: Findings consistent with appendicitis possibly with rupture.
Inflammation extends to involve the cecum.

Critical Value/emergent results were called by telephone at the
time of interpretation on 04/19/2013  at [DATE] p.m. to Dr.
Sheetal, who verbally acknowledged these results.

## 2022-06-06 DEATH — deceased
